# Patient Record
Sex: Male | Born: 2000 | Race: Black or African American | Hispanic: No | Marital: Single | Smoking: Never smoker
Health system: Southern US, Community
[De-identification: ages and names within clinical notes are randomized; demographics above are authoritative.]

## PROBLEM LIST (undated history)

## (undated) DIAGNOSIS — T7840XA Allergy, unspecified, initial encounter: Secondary | ICD-10-CM

## (undated) HISTORY — PX: DENTAL SURGERY: SHX609

## (undated) HISTORY — PX: ANKLE SURGERY: SHX546

## (undated) HISTORY — DX: Allergy, unspecified, initial encounter: T78.40XA

---
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
## 2022-02-25 IMAGING — DX DG ANKLE COMPLETE 3+V*L*
3 series · 4 of 4 positions shown · non-contrast
Comparison: None.

CLINICAL DATA: Left calcaneal pain, tenderness, basketball injury

EXAM:
LEFT ANKLE COMPLETE - 3+ VIEW

[ankle ap]
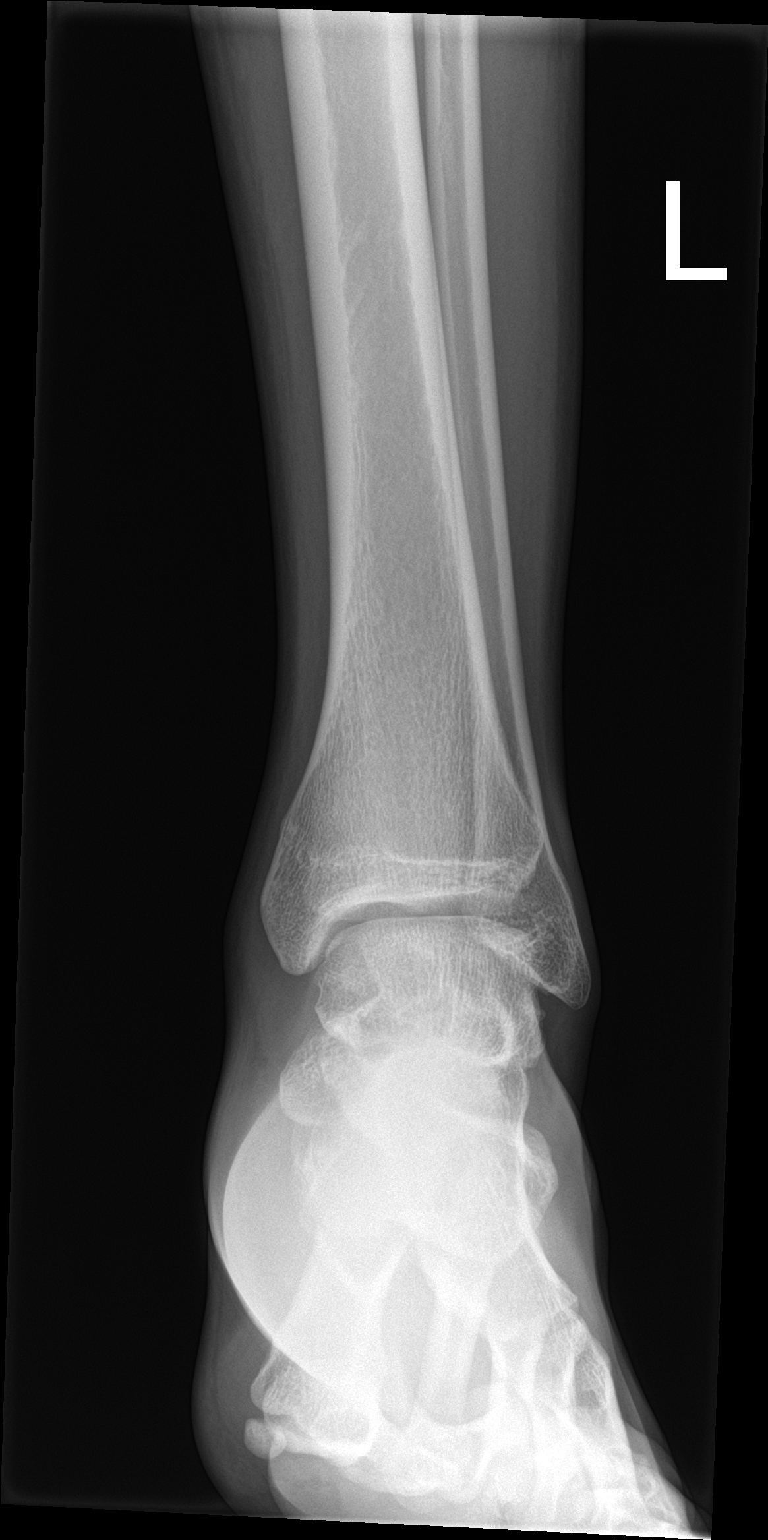

[Series 2: ankle obl · 0.14mm/px · 2 of 2 slices shown]
[im 1/2]
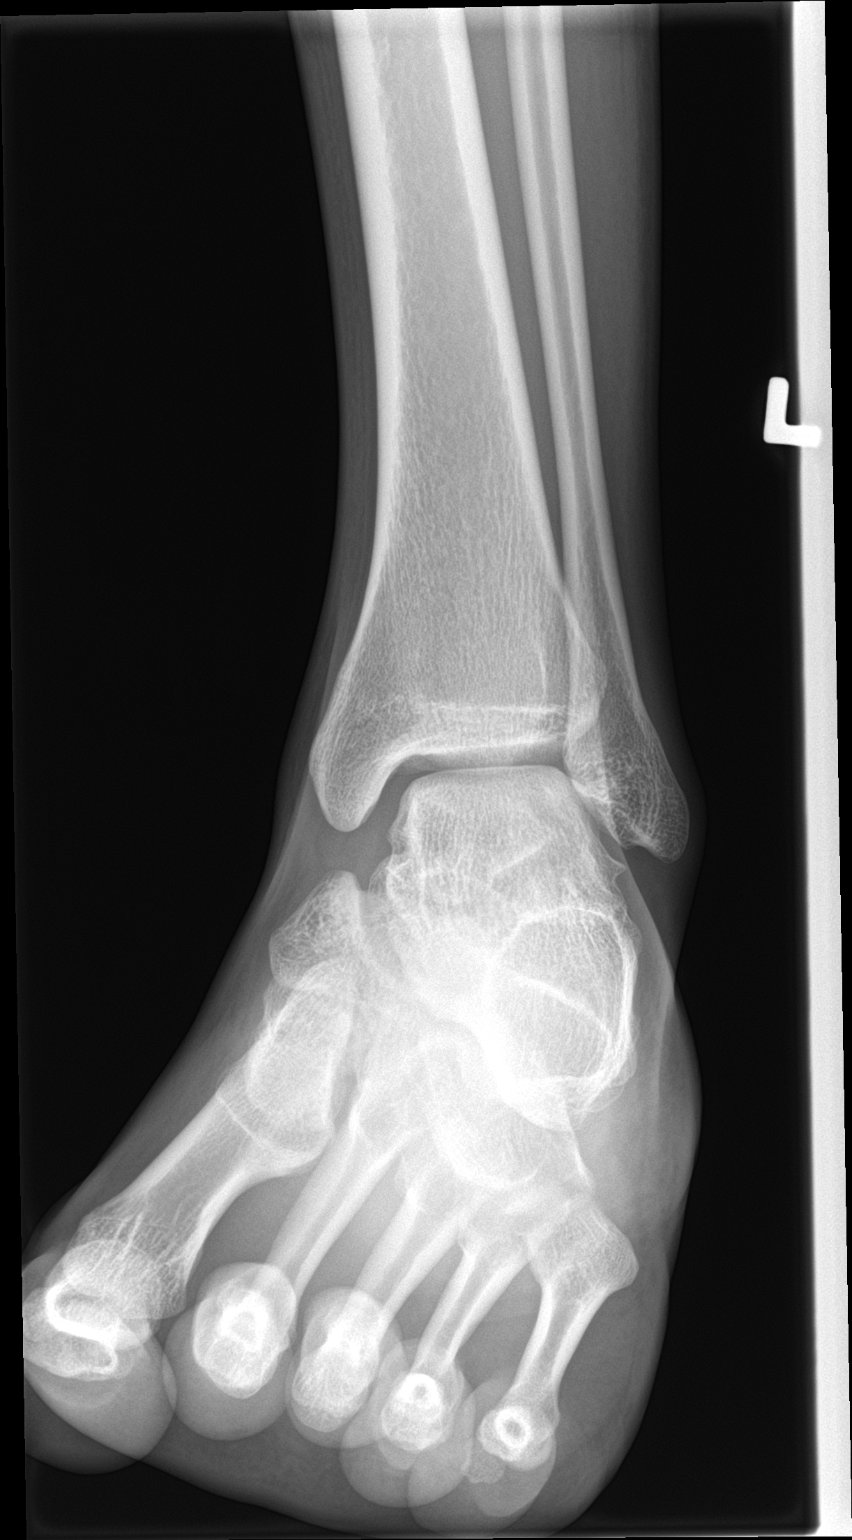
[im 2/2]
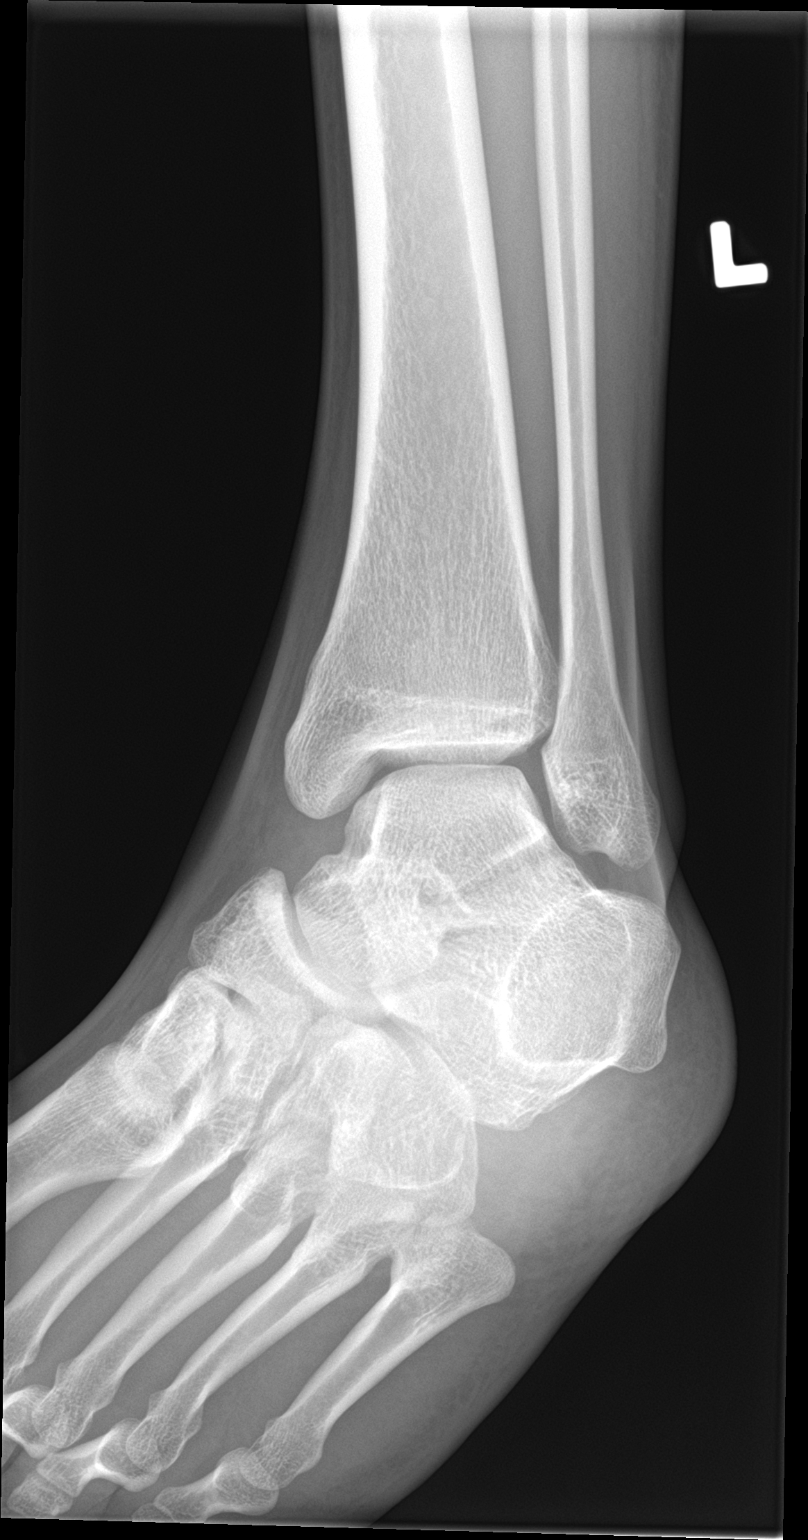

[ankle lat]
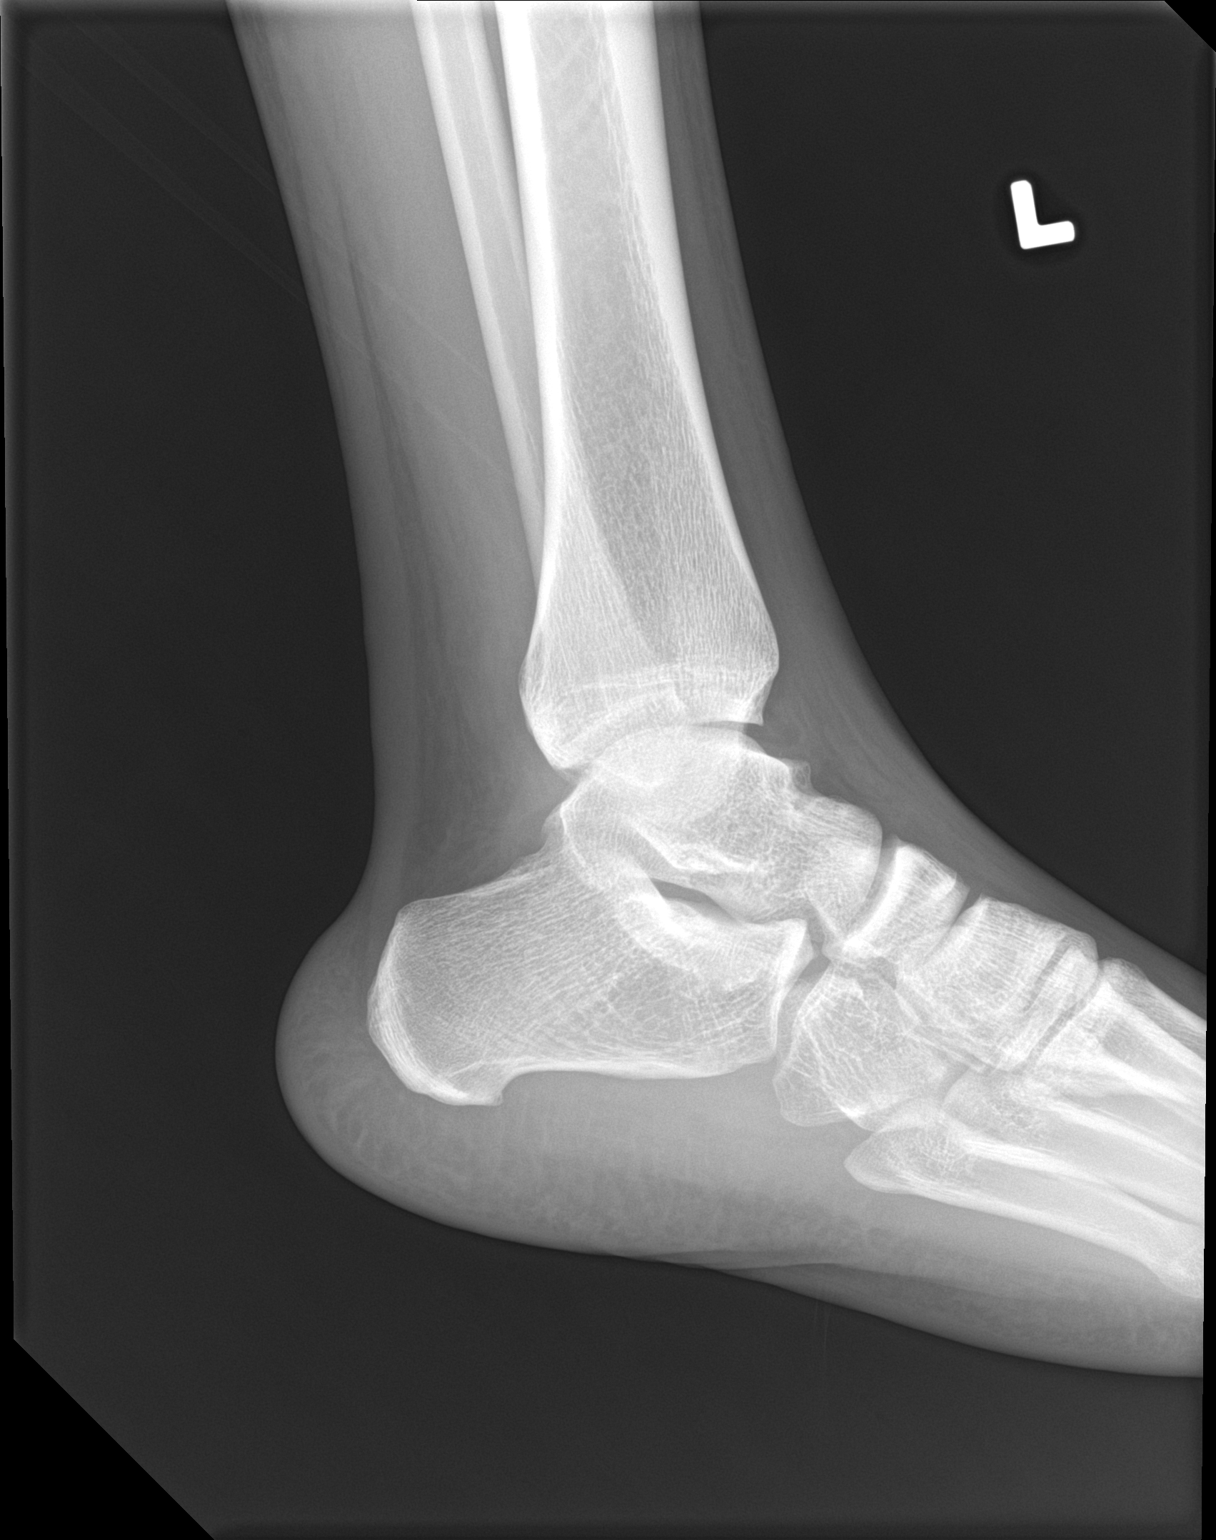

[4 of 4 positions shown; findings below may reference images not displayed]

FINDINGS: Frontal, oblique, and lateral views of the left ankle are obtained.
No acute displaced fracture. Alignment is anatomic. Joint spaces are
well preserved. Soft tissues are normal.
IMPRESSION: 1. Unremarkable left ankle.

## 2022-12-16 ENCOUNTER — Encounter: Payer: Self-pay | Admitting: Family Medicine

## 2022-12-16 ENCOUNTER — Ambulatory Visit (INDEPENDENT_AMBULATORY_CARE_PROVIDER_SITE_OTHER): Payer: BC Managed Care – PPO | Admitting: Family Medicine

## 2022-12-16 VITALS — BP 122/83 | HR 79 | Temp 98.1°F | Resp 16 | Ht 70.0 in | Wt 160.0 lb

## 2022-12-16 DIAGNOSIS — Z1159 Encounter for screening for other viral diseases: Secondary | ICD-10-CM

## 2022-12-16 DIAGNOSIS — Z0000 Encounter for general adult medical examination without abnormal findings: Secondary | ICD-10-CM | POA: Diagnosis not present

## 2022-12-16 DIAGNOSIS — N4889 Other specified disorders of penis: Secondary | ICD-10-CM | POA: Insufficient documentation

## 2022-12-16 DIAGNOSIS — L853 Xerosis cutis: Secondary | ICD-10-CM | POA: Diagnosis not present

## 2022-12-16 DIAGNOSIS — R7302 Impaired glucose tolerance (oral): Secondary | ICD-10-CM | POA: Diagnosis not present

## 2022-12-16 DIAGNOSIS — B009 Herpesviral infection, unspecified: Secondary | ICD-10-CM

## 2022-12-16 DIAGNOSIS — L84 Corns and callosities: Secondary | ICD-10-CM | POA: Insufficient documentation

## 2022-12-16 DIAGNOSIS — M419 Scoliosis, unspecified: Secondary | ICD-10-CM | POA: Insufficient documentation

## 2022-12-16 DIAGNOSIS — L309 Dermatitis, unspecified: Secondary | ICD-10-CM | POA: Insufficient documentation

## 2022-12-16 DIAGNOSIS — E559 Vitamin D deficiency, unspecified: Secondary | ICD-10-CM | POA: Insufficient documentation

## 2022-12-16 DIAGNOSIS — Z13220 Encounter for screening for lipoid disorders: Secondary | ICD-10-CM

## 2022-12-16 DIAGNOSIS — J309 Allergic rhinitis, unspecified: Secondary | ICD-10-CM | POA: Insufficient documentation

## 2022-12-16 DIAGNOSIS — Z7689 Persons encountering health services in other specified circumstances: Secondary | ICD-10-CM

## 2022-12-16 DIAGNOSIS — M2140 Flat foot [pes planus] (acquired), unspecified foot: Secondary | ICD-10-CM | POA: Insufficient documentation

## 2022-12-16 NOTE — Progress Notes (Signed)
Complete physical exam and establish care   Patient: Wayne Dunlap   DOB: 29-Jan-2001   22 y.o. Male  MRN: [id number]  Subjective:    Chief Complaint  Patient presents with   Establish Care    Wayne Dunlap is a 22 y.o. male who presents today for a complete physical exam. He reports consuming a general diet.  Playing basketball  He generally feels fairly well. He reports sleeping fairly well. He does not have additional problems to discuss today.   He does reports bilateral ankle popping, neck and back pain. He has stopped working at [employer] and he reports the back pain has improved. He does report constant lifting boxes. He has worked there for a year.  He has no allergies.  He uses Valtrex for outbreaks prn.  He isn't fasting. He reports cracking of his right foot. He does report feet sweating. He used aquaphor on this and it helped. He reports this occurred in the last year. Cracking and crusting. No itching or pain. He reports his nails aren't dark.  Most recent fall risk assessment:    04/29/2015    4:04 PM  Fall Risk   Falls in the past year? 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   Scoliosis 12/16/2022   Vitamin D deficiency 12/16/2022   Past Medical History:  Diagnosis Date   Allergy    Past Surgical History:  Procedure Laterality Date   ANKLE SURGERY Right    DENTAL SURGERY     Social History   Tobacco Use   Smoking status: Never  Vaping Use   Vaping Use: Never used  Substance Use Topics   Alcohol use: Yes    Alcohol/week: 2.0 standard drinks of alcohol    Types: 1 Glasses of wine, 1 Shots of liquor per week    Comment:  social drinker   Drug use: Yes    Types: Marijuana   Social History   Socioeconomic History   Marital status: Single    Spouse name: Not on file   Number of children: Not on file   Years of education: Not on file   Highest education level: Not on file  Occupational History   Not on file  Tobacco Use   Smoking status: Never   Smokeless tobacco: Not on file  Vaping Use   Vaping Use: Never used  Substance and Sexual Activity   Alcohol use: Yes    Alcohol/week: 2.0 standard drinks of alcohol    Types: 1 Glasses of wine, 1 Shots of liquor per week    Comment: social drinker   Drug use: Yes    Types: Marijuana   Sexual activity: Yes    Birth control/protection: Condom  Other Topics Concern   Not on file  Social History Narrative   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    Social Determinants of Health   Financial Resource Strain: Not on file  Food Insecurity: Not on file  Transportation Needs: Not on file  Physical Activity: Not on file  Stress: Not on file  Social Connections: Not  on file  Intimate Partner Violence: Not on file   Family History  Problem Relation Age of Onset   Hypertension Father    Diabetes Maternal Grandmother    Kidney disease Maternal Grandmother    No Known Allergies    Patient Care Team: Suzan Slick, MD as PCP - General (Family Medicine)   Outpatient Medications Prior to Visit  Medication Sig   valACYclovir (VALTREX) 1000 MG tablet Take 1,000 mg by mouth 2 (two) times daily.   No facility-administered medications prior to visit.    Review of Systems  Musculoskeletal:  Positive for joint pain.  Skin:        Peeling skin on right foot  All other systems reviewed and are negative.        Objective:     BP 122/83   Pulse 79   Temp 98.1 F (36.7 C) (Oral)   Resp 16   Ht 5\' 10"  (1.778 m)   Wt 160 lb (72.6 kg)   SpO2 100%   BMI 22.96 kg/m  BP Readings from Last 3 Encounters:  12/16/22 122/83  08/31/21  117/72  12/08/19 (!) 128/63 (82 %, Z = 0.92 /  28 %, Z = -0.58)*   *BP percentiles are based on the 2017 AAP Clinical Practice Guideline for boys      Physical Exam Vitals and nursing note reviewed.  Constitutional:      Appearance: Normal appearance. He is normal weight.  HENT:     Head: Normocephalic and atraumatic.     Right Ear: Tympanic membrane, ear canal and external ear normal.     Left Ear: Tympanic membrane, ear canal and external ear normal.     Nose: Nose normal.     Mouth/Throat:     Mouth: Mucous membranes are moist.     Pharynx: Oropharynx is clear.  Eyes:     Conjunctiva/sclera: Conjunctivae normal.     Pupils: Pupils are equal, round, and reactive to light.  Cardiovascular:     Rate and Rhythm: Normal rate and regular rhythm.     Pulses: Normal pulses.     Heart sounds: Normal heart sounds.  Pulmonary:     Effort: Pulmonary effort is normal.     Breath sounds: Normal breath sounds.  Abdominal:     General: Abdomen is flat. Bowel sounds are normal.  Musculoskeletal:        General: Normal range of motion.     Cervical back: Normal range of motion.  Skin:    General: Skin is warm.     Capillary Refill: Capillary refill takes less than 2 seconds.     Comments: Dry scaly right heel  Neurological:     General: No focal deficit present.     Mental Status: He is alert and oriented to person, place, and time. Mental status is at baseline.  Psychiatric:        Mood and Affect: Mood normal.        Behavior: Behavior normal.        Thought Content: Thought content normal.        Judgment: Judgment normal.     No results found for any visits on 12/16/22.      Assessment & Plan:    Routine Health Maintenance and Physical Exam  Immunization History  Administered Date(s) Administered   DTaP 11/09/2002, 01/11/2003, 03/15/2003, 12/13/2003, 08/25/2006   HIB (PRP-T) 11/09/2002, 01/11/2003, 03/15/2003, 12/13/2003   Hepatitis A, Ped/Adol-2 Dose 08/31/2007,  09/08/2008  Hepatitis B, PED/ADOLESCENT 2001-06-05, 10/07/2002, 06/13/2003   Hpv-Unspecified 01/31/2014, 02/06/2015   IPV 11/09/2002, 01/11/2003, 06/13/2003, 08/25/2006   MMR 09/12/2003, 08/25/2006   Meningococcal B, OMV 01/21/2019, 03/02/2019   Meningococcal Conjugate 01/31/2014, 01/21/2019   PFIZER(Purple Top)SARS-COV-2 Vaccination 11/29/2019, 12/20/2019   Pneumococcal Conjugate PCV 7 11/09/2002, 01/11/2003, 03/15/2003, 12/13/2003   Pneumococcal Conjugate-13 01/25/2013   Tdap 01/25/2013   Varicella 09/12/2003, 08/25/2006    Health Maintenance  Topic Date Due   HIV Screening  Never done   COVID-19 Vaccine (3 - Pfizer risk series) 01/17/2020   Hepatitis C Screening  Never done   HPV VACCINES (3 - Risk male 3-dose series) 12/16/2023 (Originally 06/08/2015)   INFLUENZA VACCINE  01/23/2023   DTaP/Tdap/Td (7 - Td or Tdap) 01/26/2023    Discussed health benefits of physical activity, and encouraged him to engage in regular exercise appropriate for his age and condition.  Problem List Items Addressed This Visit   None  No follow-ups on file. Annual physical exam  Encounter to establish care with new doctor  Encounter for lipid screening for cardiovascular disease -     Lipid panel  Impaired glucose tolerance -     CBC with Differential/Platelet -     Comprehensive metabolic panel -     Hemoglobin A1c  Screening for viral disease -     HIV Antibody (routine testing w rflx) -     Hepatitis C antibody  Dry skin   Screening labs Advised pt for dry cracked right heel, to try Kerasol otc for a few nights to help. Also may use Tinactin on feet to keep them from sweating and avoid fungus.  See back in 1 year prn pending results.    Suzan Slick, MD

## 2022-12-17 LAB — COMPREHENSIVE METABOLIC PANEL
ALT: 32 IU/L (ref 0–44)
AST: 28 IU/L (ref 0–40)
Albumin: 4.7 g/dL (ref 4.3–5.2)
Alkaline Phosphatase: 70 IU/L (ref 51–125)
BUN/Creatinine Ratio: 11 (ref 9–20)
BUN: 12 mg/dL (ref 6–20)
Bilirubin Total: 0.4 mg/dL (ref 0.0–1.2)
CO2: 23 mmol/L (ref 20–29)
Calcium: 9.5 mg/dL (ref 8.7–10.2)
Chloride: 102 mmol/L (ref 96–106)
Creatinine, Ser: 1.05 mg/dL (ref 0.76–1.27)
Globulin, Total: 2.4 g/dL (ref 1.5–4.5)
Glucose: 85 mg/dL (ref 70–99)
Potassium: 4.5 mmol/L (ref 3.5–5.2)
Sodium: 139 mmol/L (ref 134–144)
Total Protein: 7.1 g/dL (ref 6.0–8.5)
eGFR: 104 mL/min/{1.73_m2} (ref >59)

## 2022-12-17 LAB — LIPID PANEL
Chol/HDL Ratio: 3.1 ratio (ref 0.0–5.0)
Cholesterol, Total: 148 mg/dL (ref 100–199)
HDL: 47 mg/dL (ref >39)
LDL Chol Calc (NIH): 88 mg/dL (ref 0–99)
Triglycerides: 63 mg/dL (ref 0–149)
VLDL Cholesterol Cal: 13 mg/dL (ref 5–40)

## 2022-12-17 LAB — CBC WITH DIFFERENTIAL/PLATELET
Basophils Absolute: 0 10*3/uL (ref 0.0–0.2)
Basos: 1 %
EOS (ABSOLUTE): 0.3 10*3/uL (ref 0.0–0.4)
Eos: 7 %
Hematocrit: 40.2 % (ref 37.5–51.0)
Hemoglobin: 13.4 g/dL (ref 13.0–17.7)
Immature Grans (Abs): 0 10*3/uL (ref 0.0–0.1)
Immature Granulocytes: 0 %
Lymphocytes Absolute: 1.8 10*3/uL (ref 0.7–3.1)
Lymphs: 38 %
MCH: 29.5 pg (ref 26.6–33.0)
MCHC: 33.3 g/dL (ref 31.5–35.7)
MCV: 88 fL (ref 79–97)
Monocytes Absolute: 0.2 10*3/uL (ref 0.1–0.9)
Monocytes: 5 %
Neutrophils Absolute: 2.2 10*3/uL (ref 1.4–7.0)
Neutrophils: 49 %
Platelets: 248 10*3/uL (ref 150–450)
RBC: 4.55 x10E6/uL (ref 4.14–5.80)
RDW: 13.5 % (ref 11.6–15.4)
WBC: 4.6 10*3/uL (ref 3.4–10.8)

## 2022-12-17 LAB — HEMOGLOBIN A1C
Est. average glucose Bld gHb Est-mCnc: 108 mg/dL
Hgb A1c MFr Bld: 5.4 % (ref 4.8–5.6)

## 2022-12-17 LAB — HEPATITIS C ANTIBODY: Hep C Virus Ab: NONREACTIVE

## 2022-12-17 LAB — HIV ANTIBODY (ROUTINE TESTING W REFLEX): HIV Screen 4th Generation wRfx: NONREACTIVE

## 2022-12-25 ENCOUNTER — Emergency Department (HOSPITAL_COMMUNITY)
Admission: EM | Admit: 2022-12-25 | Discharge: 2022-12-25 | Disposition: A | Discharge status: Home / Self Care | Payer: BC Managed Care – PPO | Attending: Emergency Medicine | Admitting: Emergency Medicine

## 2022-12-25 ENCOUNTER — Emergency Department (HOSPITAL_COMMUNITY): Payer: BC Managed Care – PPO

## 2022-12-25 ENCOUNTER — Encounter (HOSPITAL_COMMUNITY): Payer: Self-pay

## 2022-12-25 ENCOUNTER — Other Ambulatory Visit: Payer: Self-pay

## 2022-12-25 DIAGNOSIS — F1990 Other psychoactive substance use, unspecified, uncomplicated: Secondary | ICD-10-CM | POA: Insufficient documentation

## 2022-12-25 LAB — CBC WITH DIFFERENTIAL/PLATELET
Abs Immature Granulocytes: 0.04 10*3/uL (ref 0.00–0.07)
Basophils Absolute: 0 10*3/uL (ref 0.0–0.1)
Basophils Relative: 1 %
Eosinophils Absolute: 0.1 10*3/uL (ref 0.0–0.5)
Eosinophils Relative: 1 %
HCT: 37.6 % — ABNORMAL LOW (ref 39.0–52.0)
Hemoglobin: 12.2 g/dL — ABNORMAL LOW (ref 13.0–17.0)
Immature Granulocytes: 1 %
Lymphocytes Relative: 18 %
Lymphs Abs: 1.5 10*3/uL (ref 0.7–4.0)
MCH: 30.2 pg (ref 26.0–34.0)
MCHC: 32.4 g/dL (ref 30.0–36.0)
MCV: 93.1 fL (ref 80.0–100.0)
Monocytes Absolute: 0.4 10*3/uL (ref 0.1–1.0)
Monocytes Relative: 5 %
Neutro Abs: 6.4 10*3/uL (ref 1.7–7.7)
Neutrophils Relative %: 74 %
Platelets: 202 10*3/uL (ref 150–400)
RBC: 4.04 MIL/uL — ABNORMAL LOW (ref 4.22–5.81)
RDW: 14 % (ref 11.5–15.5)
WBC: 8.6 10*3/uL (ref 4.0–10.5)
nRBC: 0 % (ref 0.0–0.2)

## 2022-12-25 LAB — BASIC METABOLIC PANEL
Anion gap: 10 (ref 5–15)
BUN: 19 mg/dL (ref 6–20)
CO2: 22 mmol/L (ref 22–32)
Calcium: 8.3 mg/dL — ABNORMAL LOW (ref 8.9–10.3)
Chloride: 106 mmol/L (ref 98–111)
Creatinine, Ser: 1.23 mg/dL (ref 0.61–1.24)
GFR, Estimated: >60 mL/min (ref >60)
Glucose, Bld: 148 mg/dL — ABNORMAL HIGH (ref 70–99)
Potassium: 3.9 mmol/L (ref 3.5–5.1)
Sodium: 138 mmol/L (ref 135–145)

## 2022-12-25 MED ORDER — SODIUM CHLORIDE 0.9 % IV BOLUS
1000.0000 mL | Freq: Once | INTRAVENOUS | Status: AC
  Administered 2022-12-25: 1000 mL via INTRAVENOUS

## 2022-12-25 NOTE — ED Provider Notes (Cosign Needed Addendum)
[place] EMERGENCY DEPARTMENT AT [facility] Provider Note   CSN: [id number] Arrival date & time: 12/25/22  0341     History  Chief Complaint  Patient presents with   Addiction Problem    Wayne Dunlap is a 22 y.o. male who presents emergency department brought in by EMS with concerns for drug use prior to arrival.  Per EMS, patient took a THC gummy.  Patient was found to be vomiting on scene along with hallucinations.  Patient was given Zofran prior to arrival by EMS.  Patient declines chest pain, shortness of breath, abdominal pain, nausea, vomiting.   The history is provided by the patient and the EMS personnel. No language interpreter was used.       Home Medications Prior to Admission medications   Medication Sig Start Date End Date Taking? Authorizing Provider  valACYclovir (VALTREX) 1000 MG tablet Take 1,000 mg by mouth 2 (two) times daily. 12/09/22  Yes [provider]      Allergies    Patient has no known allergies.    Review of Systems   Review of Systems  All other systems reviewed and are negative.   Physical Exam Updated Vital Signs BP (!) 158/73   Pulse 69   Temp 98.4 F (36.9 C) (Oral)   Resp 18   SpO2 99%  Physical Exam Vitals and nursing note reviewed.  Constitutional:      General: He is not in acute distress.    Appearance: He is not diaphoretic.  HENT:     Head: Normocephalic and atraumatic.     Mouth/Throat:     Pharynx: No oropharyngeal exudate.  Eyes:     General: No scleral icterus.    Conjunctiva/sclera: Conjunctivae normal.     Comments: Dilated pupils  Cardiovascular:     Rate and Rhythm: Normal rate and regular rhythm.     Pulses: Normal pulses.     Heart sounds: Normal heart sounds.  Pulmonary:     Effort: Pulmonary effort is normal. No respiratory distress.     Breath sounds: Normal breath sounds. No wheezing.  Chest:     Chest wall: No tenderness.     Comments: No chest wall TTP.  Abdominal:      General: Bowel sounds are normal.     Palpations: Abdomen is soft. There is no mass.     Tenderness: There is no abdominal tenderness. There is no guarding or rebound.     Comments: No abdominal TTP.  Musculoskeletal:        General: Normal range of motion.     Cervical back: Normal range of motion and neck supple.     Comments: No spinal TTP. No TTP noted to musculature of back.  Skin:    General: Skin is warm and dry.  Neurological:     Mental Status: He is alert.  Psychiatric:        Behavior: Behavior normal.     ED Results / Procedures / Treatments   Labs (all labs ordered are listed, but only abnormal results are displayed) Labs Reviewed  BASIC METABOLIC PANEL - Abnormal; Notable for the following components:      Result Value   Glucose, Bld 148 (*)    Calcium 8.3 (*)    All other components within normal limits  CBC WITH DIFFERENTIAL/PLATELET - Abnormal; Notable for the following components:   RBC 4.04 (*)    Hemoglobin 12.2 (*)    HCT 37.6 (*)  All other components within normal limits    EKG EKG Interpretation Date/Time:  Wednesday December 25 2022 04:32:38 EDT Ventricular Rate:  65 PR Interval:  145 QRS Duration:  94 QT Interval:  407 QTC Calculation: 424 R Axis:   69  Text Interpretation: Sinus rhythm RSR' in V1 or V2, right VCD or RVH Nonspecific ST changes likely early repolarization Confirmed by Alona Bene [id number]) on 12/25/2022 5:50:41 AM  Radiology No results found.  Procedures Procedures    Medications Ordered in ED Medications  sodium chloride 0.9 % bolus 1,000 mL (1,000 mLs Intravenous Bolus 12/25/22 0422)    ED Course/ Medical Decision Making/ A&P Clinical Course as of 12/25/22 0621  Wed Dec 25, 2022  0550 Re-evaluated and patient resting comfortably on stretcher. Parents at bedside and note that they are more than happy to take the patient home.  [SB]  0559 In depth discussion held with patient parents regarding CT scan of the head.  There  is no decision making, parents agree with scan of head at this time. [SB]    Clinical Course User Index [SB] Trejon Duford A, PA-C                             Medical Decision Making Amount and/or Complexity of Data Reviewed Labs: ordered. Radiology: ordered.   Pt presents with concerns for drug use onset prior to arrival.  Per EMS, patient took a THC gummy prior to arrival.  Patient afebrile, not tachycardic or hypoxic.  On exam patient with no acute cardiovascular, respiratory, does not findings.  Differential diagnosis includes intracranial abnormality, electrolyte abnormality, anemia, hypoglycemia.  Additional history obtained:  Additional history obtained from EMS  Labs:  I ordered, and personally interpreted labs.  The pertinent results include:   CBC unremarkable BMP unremarkable  Imaging: I ordered imaging studies including CT head ordered results pending at time of signout   Medications:  I ordered medication including IVF for symptom management I have reviewed the patients home medicines and have made adjustments as needed  Patient case discussed with Solon Augusta, PA-C at sign-out. Plan at sign-out is pending CT head, likely Discharge home, however, plans may change as per oncoming team. Patient care transferred at sign out.    This chart was dictated using voice recognition software, Dragon. Despite the best efforts of this provider to proofread and correct errors, errors may still occur which can change documentation meaning.   Final Clinical Impression(s) / ED Diagnoses Final diagnoses:  Drug use    Rx / DC Orders ED Discharge Orders     None         Zaela Graley A, PA-C 12/25/22 0645    Dakiyah Heinke A, PA-C 12/25/22 6045    Maia Plan, MD 12/26/22 0600

## 2022-12-25 NOTE — Discharge Instructions (Signed)
There were no emergent concerning findings today on your lab or imaging studies.  Ensure to maintain fluid intake with water, tea, broth, soup, Pedialyte, Gatorade.  It is important that you do not consume illicit drugs.  You may follow-up with your primary care provider as needed.  Return to the emergency department if you are experiencing increasing/worsening symptoms.

## 2022-12-25 NOTE — ED Provider Notes (Signed)
Patient is well appearing on exam.   Speaking full sentences, alert and oriented, smile symmetric, strength in all extremities normal.  Patient is feeling much improved.  No symptoms currently apart from and feeling that he is not speaking as quickly and fluently as he normally does.  Will discharge home with father.  Return precautions discussed follow-up with primary care at their leisure.  Counseled on cessation.   Solon Augusta [place], [place] 12/25/22 0740    Jacalyn Lefevre, MD 12/25/22 1037

## 2022-12-25 NOTE — ED Triage Notes (Signed)
Pt BIB EMS from [facility] after taking a thc gummy. Pt vomiting on scene along with hallucinating.   130/100 70 hr 179 cbg  20g Left Forearm  4mg  zofran given
